# Patient Record
Sex: Female | Born: 1990 | Hispanic: Yes | Marital: Single | Smoking: Current every day smoker
Health system: Southern US, Community
[De-identification: ages and names within clinical notes are randomized; demographics above are authoritative.]

## PROBLEM LIST (undated history)

## (undated) HISTORY — PX: CHOLECYSTECTOMY: SHX55

---

## 2017-05-21 ENCOUNTER — Emergency Department (HOSPITAL_BASED_OUTPATIENT_CLINIC_OR_DEPARTMENT_OTHER): Payer: Medicaid Other

## 2017-05-21 ENCOUNTER — Other Ambulatory Visit: Payer: Self-pay

## 2017-05-21 ENCOUNTER — Emergency Department (HOSPITAL_BASED_OUTPATIENT_CLINIC_OR_DEPARTMENT_OTHER)
Admission: EM | Admit: 2017-05-21 | Discharge: 2017-05-21 | Disposition: A | Discharge status: Home / Self Care | Payer: Medicaid Other | Attending: Emergency Medicine | Admitting: Emergency Medicine

## 2017-05-21 ENCOUNTER — Encounter (HOSPITAL_BASED_OUTPATIENT_CLINIC_OR_DEPARTMENT_OTHER): Payer: Self-pay

## 2017-05-21 DIAGNOSIS — R05 Cough: Secondary | ICD-10-CM | POA: Insufficient documentation

## 2017-05-21 DIAGNOSIS — F17210 Nicotine dependence, cigarettes, uncomplicated: Secondary | ICD-10-CM | POA: Diagnosis not present

## 2017-05-21 DIAGNOSIS — J020 Streptococcal pharyngitis: Secondary | ICD-10-CM | POA: Diagnosis not present

## 2017-05-21 DIAGNOSIS — R070 Pain in throat: Secondary | ICD-10-CM | POA: Diagnosis present

## 2017-05-21 LAB — RAPID STREP SCREEN (MED CTR MEBANE ONLY): STREPTOCOCCUS, GROUP A SCREEN (DIRECT): POSITIVE — AB

## 2017-05-21 MED ORDER — ONDANSETRON 8 MG PO TBDP
8.0000 mg | ORAL_TABLET | Freq: Once | ORAL | Status: AC
  Administered 2017-05-21: 8 mg via ORAL
  Filled 2017-05-21: qty 1

## 2017-05-21 MED ORDER — PENICILLIN G BENZATHINE 1200000 UNIT/2ML IM SUSP
1.2000 10*6.[IU] | Freq: Once | INTRAMUSCULAR | Status: AC
  Administered 2017-05-21: 1.2 10*6.[IU] via INTRAMUSCULAR
  Filled 2017-05-21: qty 2

## 2017-05-21 MED ORDER — DEXAMETHASONE 1 MG/ML PO CONC
10.0000 mg | Freq: Once | ORAL | Status: AC
  Administered 2017-05-21: 10 mg via ORAL
  Filled 2017-05-21: qty 10

## 2017-05-21 MED ORDER — DEXAMETHASONE SODIUM PHOSPHATE 10 MG/ML IJ SOLN
INTRAMUSCULAR | Status: AC
  Filled 2017-05-21: qty 1

## 2017-05-21 NOTE — ED Provider Notes (Signed)
[facility] EMERGENCY DEPARTMENT Provider Note   CSN: [id number] Arrival date & time: 05/21/17  1812     History   Chief Complaint Chief Complaint  Patient presents with  . Cough    HPI Nancy Maxwell is a 27 y.o. female who presents with a sore throat.  No significant past medical history.  She states that she has had an intermittent sore throat for the past month.  She tried taking allergy medicine which did provide some relief for a couple of weeks however the pain worsened.  She states that over the past week her throat has been swollen and she had difficulty swallowing food.  She has been able to drink water.  Over the past couple of days she has had nausea and vomiting.  She also has had chest pain and cough.  Is a coarse cough however she is not able to cough anything up.  She had diffuse abdominal pain.  No fever, urinary symptoms, or diarrhea.  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  HPI  History reviewed. No pertinent past medical history.  There are no active problems to display for this patient.   Past Surgical History:  Procedure Laterality Date  . CHOLECYSTECTOMY       OB History   None      Home Medications    Prior to Admission medications   Not on File    Family History No family history on file.  Social History Social History   Tobacco Use  . Smoking status: Current Every Day Smoker    Types: Cigarettes  . Smokeless tobacco: Never Used  Substance Use Topics  . Alcohol use: Never    Frequency: Never  . Drug use: Never     Allergies   Patient has no known allergies.   Review of Systems Review of Systems  Constitutional: Negative for fever.  HENT: Positive for sore throat. Negative for congestion.   Respiratory: Positive for cough. Negative for shortness of breath.   Cardiovascular: Positive for chest pain.  Gastrointestinal: Positive for abdominal pain, nausea and vomiting.  Negative for diarrhea.  Genitourinary: Negative for dysuria.  All other systems reviewed and are negative.    Physical Exam Updated Vital Signs BP 128/70 (BP Location: Left Arm)   Pulse 67   Temp 98.3 F (36.8 C) (Oral)   Resp 16   Ht  (1.549 m)   Wt 81.6 kg (180 lb)   LMP 04/28/2017 (Approximate)   SpO2 100%   BMI 34.01 kg/m   Physical Exam  Constitutional: She is oriented to person, place, and time. She appears well-developed and well-nourished. No distress.  Calm and cooperative.  Well-appearing.  HENT:  Head: Normocephalic and atraumatic.  Right Ear: Tympanic membrane normal.  Left Ear: Tympanic membrane normal.  Nose: Nose normal.  Mouth/Throat: Uvula is midline. No posterior oropharyngeal edema or posterior oropharyngeal erythema. Tonsils are 2+ on the right. Tonsils are 2+ on the left.  Eyes: Pupils are equal, round, and reactive to light. Conjunctivae are normal. Right eye exhibits no discharge. Left eye exhibits no discharge. No scleral icterus.  Neck: Normal range of motion.  Cardiovascular: Normal rate and regular rhythm.  Pulmonary/Chest: Effort normal and breath sounds normal. No respiratory distress.  Abdominal: She exhibits no distension.  Neurological: She is alert and oriented to person, place, and time.  Skin: Skin is warm and dry.  Psychiatric: She has a normal mood and affect. Her  behavior is normal.  Nursing note and vitals reviewed.    ED Treatments / Results  Labs (all labs ordered are listed, but only abnormal results are displayed) Labs Reviewed  RAPID STREP SCREEN ([facility] ONLY) - Abnormal; Notable for the following components:      Result Value   Streptococcus, Group A Screen (Direct) POSITIVE (*)    All other components within normal limits    EKG None  Radiology Dg Chest 2 View  Result Date: 05/21/2017 CLINICAL DATA:  27 year old female with cough. EXAM: CHEST - 2 VIEW COMPARISON:  None. FINDINGS: The heart size and  mediastinal contours are within normal limits. Both lungs are clear. The visualized skeletal structures are unremarkable. IMPRESSION: No active cardiopulmonary disease. Electronically Signed   By: Elgie Collard M.D.   On: 05/21/2017 21:28    Procedures Procedures (including critical care time)  Medications Ordered in ED Medications  dexamethasone (DECADRON) 10 MG/ML injection (  Canceled Entry 05/21/17 2138)  dexamethasone (DECADRON) 1 MG/ML solution 10 mg (10 mg Oral Given 05/21/17 2119)  ondansetron (ZOFRAN-ODT) disintegrating tablet 8 mg (8 mg Oral Given 05/21/17 2120)  penicillin g benzathine (BICILLIN LA) 1200000 UNIT/2ML injection 1.2 Million Units (1.2 Million Units Intramuscular Given 05/21/17 2204)     Initial Impression / Assessment and Plan / ED Course  I have reviewed the triage vital signs and the nursing notes.  Pertinent labs & imaging results that were available during my care of the patient were reviewed by me and considered in my medical decision making (see chart for details).  28 year old female with sore throat and difficulty swallowing.  Her vital signs are normal.  She is been drinking water in the emergency department.  Her strep is positive.  She was given IM penicillin.  She is advised to take over-the-counter medicines for pain.  She was given a work note and advised to return give her symptoms some time to improve but she can return if she feels like she is worsening.  Final Clinical Impressions(s) / ED Diagnoses   Final diagnoses:  Strep pharyngitis    ED Discharge Orders    None       Bethel Born, PA-C 05/21/17 2225    Pricilla Loveless, MD 05/27/17 [phone]

## 2017-05-21 NOTE — ED Triage Notes (Signed)
C/o flu like sx x 1 month-worse x 1 week-NAD-steady gait 

## 2017-05-21 NOTE — Discharge Instructions (Addendum)
Please take Ibuprofen or Tylenol for pain Please drink plenty of fluids Return if you are getting worse

## 2017-05-22 ENCOUNTER — Telehealth (HOSPITAL_BASED_OUTPATIENT_CLINIC_OR_DEPARTMENT_OTHER): Payer: Self-pay | Admitting: Emergency Medicine

## 2017-05-29 ENCOUNTER — Emergency Department (HOSPITAL_BASED_OUTPATIENT_CLINIC_OR_DEPARTMENT_OTHER)
Admission: EM | Admit: 2017-05-29 | Discharge: 2017-05-29 | Disposition: A | Discharge status: Home / Self Care | Payer: Medicaid Other | Attending: Physician Assistant | Admitting: Physician Assistant

## 2017-05-29 ENCOUNTER — Encounter: Payer: Self-pay | Admitting: Emergency Medicine

## 2017-05-29 DIAGNOSIS — J020 Streptococcal pharyngitis: Secondary | ICD-10-CM

## 2017-05-29 DIAGNOSIS — F17210 Nicotine dependence, cigarettes, uncomplicated: Secondary | ICD-10-CM | POA: Diagnosis not present

## 2017-05-29 DIAGNOSIS — R070 Pain in throat: Secondary | ICD-10-CM | POA: Diagnosis present

## 2017-05-29 LAB — RAPID STREP SCREEN (MED CTR MEBANE ONLY): Streptococcus, Group A Screen (Direct): POSITIVE — AB

## 2017-05-29 MED ORDER — ACETAMINOPHEN 325 MG PO TABS
650.0000 mg | ORAL_TABLET | Freq: Once | ORAL | Status: AC
  Administered 2017-05-29: 650 mg via ORAL
  Filled 2017-05-29: qty 2

## 2017-05-29 MED ORDER — CEPHALEXIN 500 MG PO CAPS: 500.0000 mg | ORAL_CAPSULE | Freq: Two times a day (BID) | ORAL | 0 refills | Status: AC

## 2017-05-29 NOTE — ED Provider Notes (Signed)
[facility] EMERGENCY DEPARTMENT Provider Note   CSN: [id number] Arrival date & time: 05/29/17  1900     History   Chief Complaint Chief Complaint  Patient presents with  . Sore Throat    HPI Nancy Maxwell is a 27 y.o. female.  HPI   Ms. Nancy Maxwell is a 27yo female who is [redacted] weeks pregnant that presents to the emergency department for evaluation of ongoing sore throat.  She was seen in the ED last week where she had a positive rapid strep test and was treated with IM penicillin and Decadron.  Patient states that she improved for a couple of days, but her symptoms returned 2 days ago.  She reports the back of her throat feels like it is "inflames and burning."  Reports pain is constant and 9/10 in severity, but acutely worsened with swallowing.  She states the pain is worse on the right side.  Is able to eat and drink despite pain.  She is also reporting right ear pain, denies associated hearing loss, tinnitus, otorrhea.  She has tried taking Benadryl for her symptoms without improvement.  She endorses chills a couple of days ago, but denies measured temperature.  She denies neck pain, cough, congestion, abdominal pain, nausea/vomiting.   History reviewed. No pertinent past medical history.  There are no active problems to display for this patient.   Past Surgical History:  Procedure Laterality Date  . CHOLECYSTECTOMY       OB History    Gravida  1   Para      Term      Preterm      AB      Living        SAB      TAB      Ectopic      Multiple      Live Births               Home Medications    Prior to Admission medications   Not on File    Family History History reviewed. No pertinent family history.  Social History Social History   Tobacco Use  . Smoking status: Current Every Day Smoker    Types: Cigarettes  . Smokeless tobacco: Never Used  Substance Use Topics  . Alcohol use: Never    Frequency: Never  .  Drug use: Never     Allergies   Patient has no known allergies.   Review of Systems Review of Systems  Constitutional: Positive for chills.  HENT: Positive for sore throat. Negative for congestion, rhinorrhea and trouble swallowing.   Respiratory: Negative for cough and shortness of breath.   Gastrointestinal: Negative for abdominal pain, nausea and vomiting.  Musculoskeletal: Negative for neck pain.     Physical Exam Updated Vital Signs BP 132/87 (BP Location: Left Arm)   Pulse (!) 59   Temp 98 F (36.7 C) (Oral)   Resp 16   Ht  (1.549 m)   Wt 81.6 kg (180 lb)   LMP  (LMP Unknown)   SpO2 100%   BMI 34.01 kg/m   Physical Exam  Constitutional: She appears well-developed and well-nourished. No distress.  Today bedside in no apparent distress, nontoxic-appearing.  HENT:  Head: Normocephalic and atraumatic.  Mucous membranes moist.  Posterior oropharynx without erythema.  3+ bilateral tonsillar swelling, no exudate.  Uvula midline.  No trismus.  Airway patent and able to handle oral secretions.  Bilateral TMs with good  cone of light.  No rhinorrhea.  No tenderness over maxillary or frontal sinuses.  Eyes: Pupils are equal, round, and reactive to light. Conjunctivae are normal. Right eye exhibits no discharge. Left eye exhibits no discharge.  Neck:  Lymphadenopathy of one lymph node in right anterior cervical chain.  It is mildly tender to palpation.  Full neck ROM.  Cardiovascular: Normal rate, regular rhythm and intact distal pulses. Exam reveals no friction rub.  No murmur heard. Pulmonary/Chest: Effort normal and breath sounds normal. No stridor. No respiratory distress. She has no wheezes. She has no rales.  Abdominal: Soft. There is no tenderness.  Neurological: She is alert. Coordination normal.  Skin: Skin is warm and dry. She is not diaphoretic.  Psychiatric: She has a normal mood and affect. Her behavior is normal.  Nursing note and vitals reviewed.    ED  Treatments / Results  Labs (all labs ordered are listed, but only abnormal results are displayed) Labs Reviewed  RAPID STREP SCREEN ([facility] ONLY) - Abnormal; Notable for the following components:      Result Value   Streptococcus, Group A Screen (Direct) POSITIVE (*)    All other components within normal limits    EKG None  Radiology No results found.  Procedures Procedures (including critical care time)  Medications Ordered in ED Medications  acetaminophen (TYLENOL) tablet 650 mg (650 mg Oral Given 05/29/17 1955)     Initial Impression / Assessment and Plan / ED Course  I have reviewed the triage vital signs and the nursing notes.  Pertinent labs & imaging results that were available during my care of the patient were reviewed by me and considered in my medical decision making (see chart for details).     Patient who is [redacted]wks pregnant presents to the emergency department for evaluation of ongoing throat pain.  Had a positive strep test and treated with penicillin IM a week ago.  On exam, she has a low-grade fever of 100 F.  Otherwise VSS. No trismus or uvula deviation. No concern for PTA or RPA. Rapid strep test positive. Fever improved with tylenol.  Plan to treat patient with keflex for 10 days given recurrent infection. Counseled her on symptomatic relief at home with warm liquids, salt water gargles and over-the-counter throat lozenges.  Is tolerating PO fluids with intact airway.  Discussed reasons to return to the emergency department.  Patient agrees voiced understanding to the above plan.   Final Clinical Impressions(s) / ED Diagnoses   Final diagnoses:  Strep throat    ED Discharge Orders        Ordered    cephALEXin (KEFLEX) 500 MG capsule  2 times daily     05/29/17 2023       Lawrence Marseilles 05/29/17 2036    Abelino Derrick, MD 05/29/17 2325

## 2017-05-29 NOTE — Discharge Instructions (Addendum)
You have strep throat.   Please take antibiotic twice a day for the next 10 days.   Avoid sharing drinks with others, as strep is contagious.   Please call and establish care with [facility] for follow up.   Return to the ER if you have any new or concerning symptoms like cannot swallow, voice change, difficulty breathing.

## 2017-05-29 NOTE — ED Triage Notes (Addendum)
Present with sore throat, and swelling to right side of throat and pain in right ear. It has been ongoing since she was here last on 5/8. She was checked for strep and given an injection at that time. She states she feels worse.  PT states she found on Friday that she is pregnant also.

## 2019-07-03 IMAGING — CR DG CHEST 2V
2 series · 2 of 2 positions shown · non-contrast
Comparison: None.

CLINICAL DATA: 27-year-old female with cough.

EXAM:
CHEST - 2 VIEW

[w chest pa]
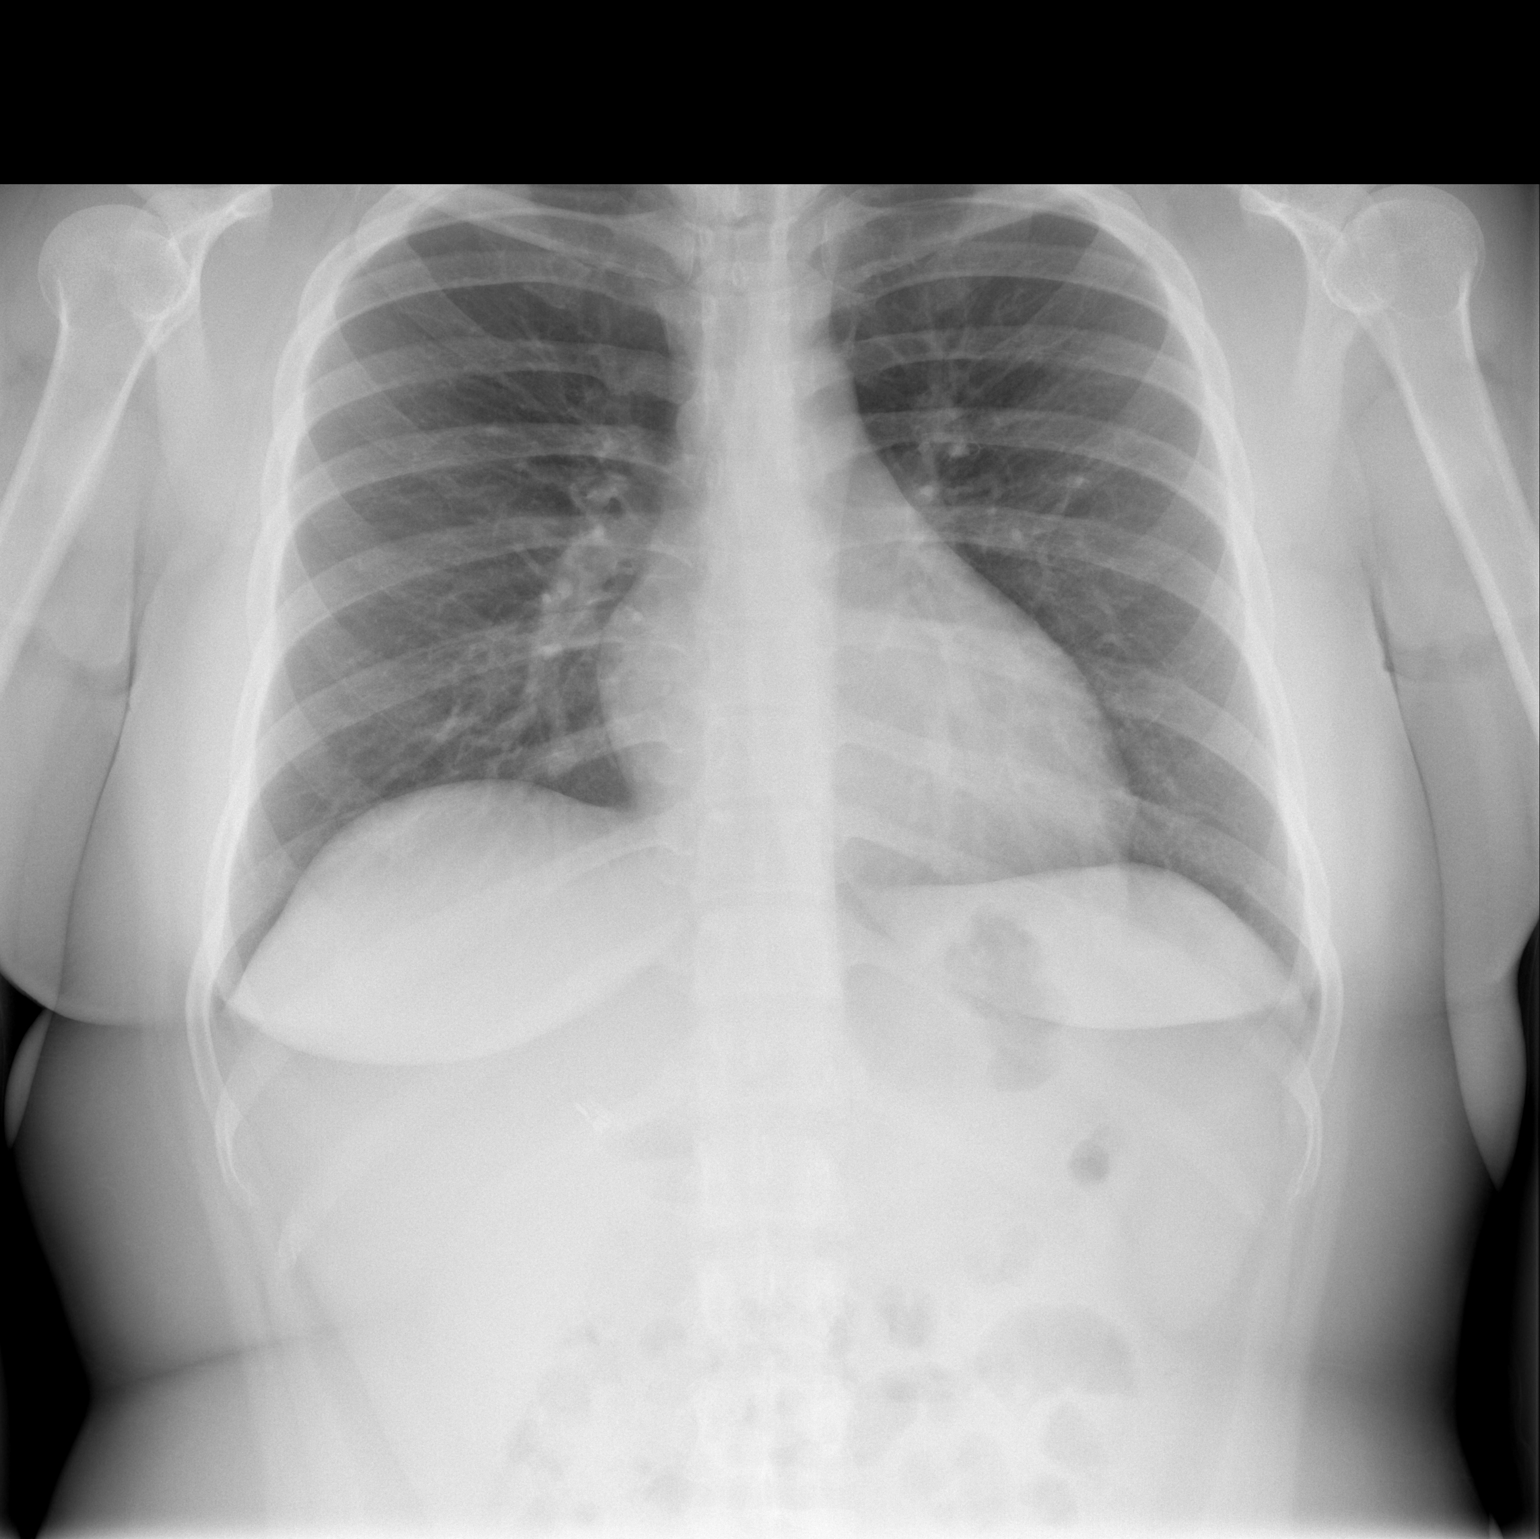

[w chest lat]
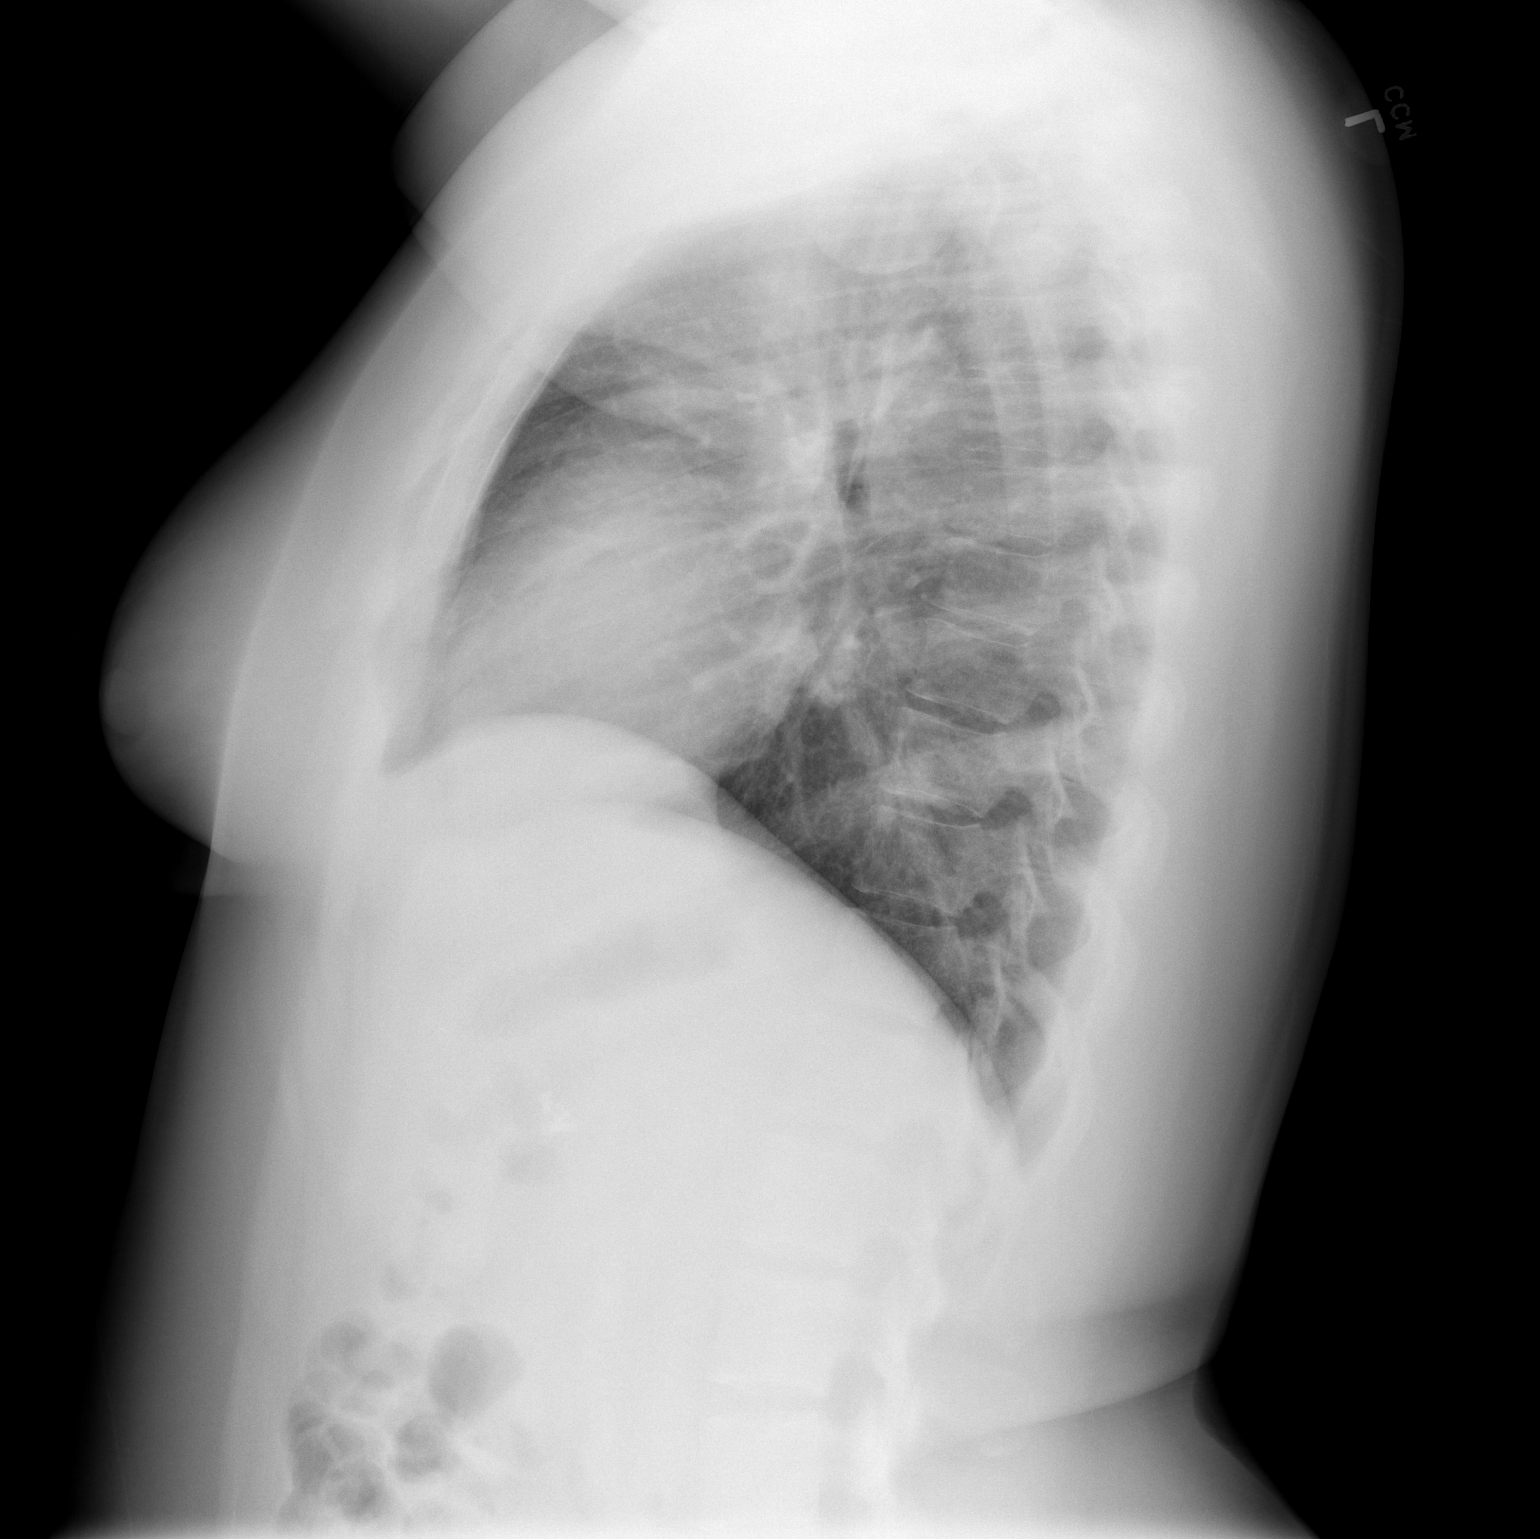

[2 of 2 positions shown; findings below may reference images not displayed]

FINDINGS: The heart size and mediastinal contours are within normal limits.
Both lungs are clear. The visualized skeletal structures are
unremarkable.
IMPRESSION: No active cardiopulmonary disease.

## 2024-01-31 ENCOUNTER — Emergency Department (HOSPITAL_BASED_OUTPATIENT_CLINIC_OR_DEPARTMENT_OTHER)
Admission: EM | Admit: 2024-01-31 | Discharge: 2024-01-31 | Disposition: A | Discharge status: Home / Self Care | Payer: Self-pay | Attending: Emergency Medicine | Admitting: Emergency Medicine

## 2024-01-31 ENCOUNTER — Encounter (HOSPITAL_BASED_OUTPATIENT_CLINIC_OR_DEPARTMENT_OTHER): Payer: Self-pay | Admitting: Emergency Medicine

## 2024-01-31 ENCOUNTER — Other Ambulatory Visit: Payer: Self-pay

## 2024-01-31 DIAGNOSIS — H02844 Edema of left upper eyelid: Secondary | ICD-10-CM | POA: Insufficient documentation

## 2024-01-31 MED ORDER — SULFAMETHOXAZOLE-TRIMETHOPRIM 800-160 MG PO TABS: 1.0000 | ORAL_TABLET | Freq: Two times a day (BID) | ORAL | 0 refills | Status: AC

## 2024-01-31 MED ORDER — AMOXICILLIN-POT CLAVULANATE 875-125 MG PO TABS: 1.0000 | ORAL_TABLET | Freq: Two times a day (BID) | ORAL | 0 refills | Status: AC

## 2024-01-31 NOTE — Discharge Instructions (Addendum)
 It was a pleasure taking care of you today.  As discussed, I suspect your symptoms are likely related to a stye however, given your green drainage I will cover you with antibiotics to treat for a possible infection.  Continue to use warm compresses to your left eye.  I have included the number of the eye doctor.  Call to schedule an appointment if symptoms do not improve over the next few days.  Return to the ER for any worsening symptoms.

## 2024-01-31 NOTE — ED Provider Notes (Signed)
 " [facility] EMERGENCY DEPARTMENT AT [facility] Provider Note   CSN: [id number] Arrival date & time: 01/31/24  1336     Patient presents with: Facial Swelling (Eye)   Nancy Maxwell is a 34 y.o. female with no significant past medical history who presents to the ED due to left eyelid edema x 4 days.  Patient notes she woke up this morning and her eye was crusted shut with green discharge.  Denies fever and chills. Denies injection. No visual changes.  Patient notes her lower eyelid had a ball present and then she developed a ball on upper eyelid.  Denies any pain with EOMs.  No injury to eye.  Does not wear contacts or glasses.  History obtained from patient and past medical records. No interpreter used during encounter.       Prior to Admission medications  Medication Sig Start Date End Date Taking? Authorizing Provider  amoxicillin -clavulanate (AUGMENTIN ) 875-125 MG tablet Take 1 tablet by mouth every 12 (twelve) hours. 01/31/24  Yes Jaylenn Baiza C, PA-C  sulfamethoxazole -trimethoprim  (BACTRIM  DS) 800-160 MG tablet Take 1 tablet by mouth 2 (two) times daily for 7 days. 01/31/24 02/07/24 Yes Tyshana Nishida, Aleck BROCKS, PA-C    Allergies: Patient has no known allergies.    Review of Systems  Constitutional:  Negative for fever.  Eyes:  Positive for pain, discharge and redness. Negative for visual disturbance.    Updated Vital Signs BP 101/73 (BP Location: Right Arm)   Pulse (!) 55   Temp 98.7 F (37.1 C) (Oral)   Resp 16   Ht 5' 1 (1.549 m)   Wt 79.4 kg   SpO2 98%   BMI 33.07 kg/m   Physical Exam Vitals and nursing note reviewed.  Constitutional:      General: She is not in acute distress.    Appearance: She is not ill-appearing.  HENT:     Head: Normocephalic.  Eyes:     Extraocular Movements: Extraocular movements intact.     Pupils: Pupils are equal, round, and reactive to light.     Comments: No injection.  Slight edema to left upper  eyelid.  See photo below.  No drainage.  EOMs intact without pain.  Cardiovascular:     Rate and Rhythm: Normal rate and regular rhythm.     Pulses: Normal pulses.     Heart sounds: Normal heart sounds. No murmur heard.    No friction rub. No gallop.  Pulmonary:     Effort: Pulmonary effort is normal.     Breath sounds: Normal breath sounds.  Abdominal:     General: Abdomen is flat. There is no distension.     Palpations: Abdomen is soft.     Tenderness: There is no abdominal tenderness. There is no guarding or rebound.  Musculoskeletal:        General: Normal range of motion.     Cervical back: Neck supple.  Skin:    General: Skin is warm and dry.  Neurological:     General: No focal deficit present.     Mental Status: She is alert.  Psychiatric:        Mood and Affect: Mood normal.        Behavior: Behavior normal.     (all labs ordered are listed, but only abnormal results are displayed) Labs Reviewed - No data to display  EKG: None  Radiology: No results found.   Procedures   Medications Ordered in the ED - No  data to display                                  Medical Decision Making  This patient presents to the ED for concern of eyelid edema, this involves an extensive number of treatment options, and is a complaint that carries with it a high risk of complications and morbidity.  The differential diagnosis includes stye, preseptal cellulitis, orbital cellulitis, conjunctivitis, injury, etc  34 year old female presents to the ED due to left eyelid edema x 4 days.  Notes she woke up this morning and her eye was glued shut with green discharge.  No injury.  Denies visual changes.  Patient notes she initially had a nodule in left lower eyelid and then developed 1 in the upper eyelid.  Does not wear contacts or glasses.  Upon arrival, stable vitals.  Patient is afebrile, not tachycardic or hypoxic.  Patient well-appearing on exam.  Does have slight edema to left upper  eyelid.  EOMs intact.  No injection.  Visual acuity 20/20 bilaterally and in each eye.  No drainage on exam.  No evidence of trauma.  Suspect symptoms likely related to a stye however, given history of green/yellow drainage with eyelid edema will cover for preseptal cellulitis and discharge with oral antibiotics. Discussed warm compresses with patient.  Low suspicion for orbital cellulitis.  Ophthalmology number given to patient at discharge and advised to call to schedule an appointment if symptoms do not improve over the next few days.  Patient stable for discharge. Strict ED precautions discussed with patient. Patient states understanding and agrees to plan. Patient discharged home in no acute distress and stable vitals  No PCP    Final diagnoses:  Edema of left upper eyelid    ED Discharge Orders          Ordered    sulfamethoxazole -trimethoprim  (BACTRIM  DS) 800-160 MG tablet  2 times daily        01/31/24 1528    amoxicillin -clavulanate (AUGMENTIN ) 875-125 MG tablet  Every 12 hours        01/31/24 1528               Lorelle Aleck JAYSON DEVONNA 01/31/24 1531    Lenor Hollering, MD 01/31/24 2315  "

## 2024-01-31 NOTE — ED Triage Notes (Signed)
 Pt c/o LT eye swelling and pain x a few days
